# Patient Record
Sex: Female | Born: 1990 | Race: White | Hispanic: No | Marital: Single | Smoking: Current every day smoker
Health system: Southern US, Community
[De-identification: ages and names within clinical notes are randomized; demographics above are authoritative.]

---

## 2013-10-25 ENCOUNTER — Encounter (HOSPITAL_COMMUNITY): Payer: Self-pay | Admitting: Emergency Medicine

## 2013-10-25 ENCOUNTER — Emergency Department (HOSPITAL_COMMUNITY): Payer: No Typology Code available for payment source

## 2013-10-25 ENCOUNTER — Emergency Department (HOSPITAL_COMMUNITY)
Admission: EM | Admit: 2013-10-25 | Discharge: 2013-10-25 | Disposition: A | Discharge status: Home / Self Care | Payer: No Typology Code available for payment source | Attending: Emergency Medicine | Admitting: Emergency Medicine

## 2013-10-25 DIAGNOSIS — S20219A Contusion of unspecified front wall of thorax, initial encounter: Secondary | ICD-10-CM | POA: Insufficient documentation

## 2013-10-25 DIAGNOSIS — F17200 Nicotine dependence, unspecified, uncomplicated: Secondary | ICD-10-CM | POA: Insufficient documentation

## 2013-10-25 DIAGNOSIS — Y9389 Activity, other specified: Secondary | ICD-10-CM | POA: Insufficient documentation

## 2013-10-25 DIAGNOSIS — Y92410 Unspecified street and highway as the place of occurrence of the external cause: Secondary | ICD-10-CM | POA: Insufficient documentation

## 2013-10-25 MED ORDER — TRAMADOL HCL 50 MG PO TABS
50.0000 mg | ORAL_TABLET | Freq: Once | ORAL | Status: AC
  Administered 2013-10-25: 50 mg via ORAL
  Filled 2013-10-25: qty 1

## 2013-10-25 MED ORDER — MELOXICAM 7.5 MG PO TABS: 15.0000 mg | ORAL_TABLET | Freq: Every day | ORAL | Status: AC

## 2013-10-25 MED ORDER — TRAMADOL HCL 50 MG PO TABS: 50.0000 mg | ORAL_TABLET | Freq: Four times a day (QID) | ORAL | Status: AC | PRN
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
## 2013-10-25 NOTE — Discharge Instructions (Signed)
Recommend Tramadol for breakthrough pain control, otherwise recommend Mobic as prescribed. Apply ice to your chest and back 3-4 times per day for at least 30 minutes each time. Follow up with a primary care doctor. Return if symptoms worsen.  Chest Contusion A chest contusion is a deep bruise on your chest area. Contusions are the result of an injury that caused bleeding under the skin. A chest contusion may involve bruising of the skin, muscles, or ribs. The contusion may turn blue, purple, or yellow. Minor injuries will give you a painless contusion, but more severe contusions may stay painful and swollen for a few weeks. CAUSES  A contusion is usually caused by a blow, trauma, or direct force to an area of the body. SYMPTOMS   Swelling and redness of the injured area.  Discoloration of the injured area.  Tenderness and soreness of the injured area.  Pain. DIAGNOSIS  The diagnosis can be made by taking a history and performing a physical exam. An X-ray, CT scan, or MRI may be needed to determine if there were any associated injuries, such as broken bones (fractures) or internal injuries. TREATMENT  Often, the best treatment for a chest contusion is resting, icing, and applying cold compresses to the injured area. Deep breathing exercises may be recommended to reduce the risk of pneumonia. Over-the-counter medicines may also be recommended for pain control. HOME CARE INSTRUCTIONS   Put ice on the injured area.  Put ice in a plastic bag.  Place a towel between your skin and the bag.  Leave the ice on for 15-20 minutes, 03-04 times a day.  Only take over-the-counter or prescription medicines as directed by your caregiver. Your caregiver may recommend avoiding anti-inflammatory medicines (aspirin, ibuprofen, and naproxen) for 48 hours because these medicines may increase bruising.  Rest the injured area.  Perform deep-breathing exercises as directed by your caregiver.  Stop smoking  if you smoke.  Do not lift objects over 5 pounds (2.3 kg) for 3 days or longer if recommended by your caregiver. SEEK IMMEDIATE MEDICAL CARE IF:   You have increased bruising or swelling.  You have pain that is getting worse.  You have difficulty breathing.  You have dizziness, weakness, or fainting.  You have blood in your urine or stool.  You cough up or vomit blood.  Your swelling or pain is not relieved with medicines. MAKE SURE YOU:   Understand these instructions.  Will watch your condition.  Will get help right away if you are not doing well or get worse. Document Released: 06/19/2001 Document Revised: 06/18/2012 Document Reviewed: 03/17/2012 [facility] Patient Information 2014 [place], [place].

## 2013-10-26 NOTE — ED Provider Notes (Signed)
Medical screening examination/treatment/procedure(s) were performed by non-physician practitioner and as supervising physician I was immediately available for consultation/collaboration.  EKG Interpretation   None        Seleen Walter, MD 10/26/13 0341 
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
